# Patient Record
Sex: Female | Born: 1995 | Hispanic: Yes | Marital: Single | Smoking: Never smoker
Health system: Southern US, Community
[De-identification: ages and names within clinical notes are randomized; demographics above are authoritative.]

## PROBLEM LIST (undated history)

## (undated) DIAGNOSIS — S42309A Unspecified fracture of shaft of humerus, unspecified arm, initial encounter for closed fracture: Secondary | ICD-10-CM

## (undated) HISTORY — DX: Unspecified fracture of shaft of humerus, unspecified arm, initial encounter for closed fracture: S42.309A

---
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
## 2016-07-02 ENCOUNTER — Ambulatory Visit: Payer: Self-pay

## 2021-07-03 ENCOUNTER — Emergency Department (HOSPITAL_COMMUNITY): Payer: No Typology Code available for payment source

## 2021-07-03 ENCOUNTER — Inpatient Hospital Stay (HOSPITAL_COMMUNITY)
Admission: EM | Admit: 2021-07-03 | Discharge: 2021-07-04 | DRG: 535 | Disposition: A | Discharge status: Other Acute Inpatient Hospital | Payer: No Typology Code available for payment source | Attending: Orthopedic Surgery | Admitting: Orthopedic Surgery

## 2021-07-03 ENCOUNTER — Other Ambulatory Visit: Payer: Self-pay

## 2021-07-03 ENCOUNTER — Encounter (HOSPITAL_COMMUNITY): Payer: Self-pay | Admitting: Emergency Medicine

## 2021-07-03 DIAGNOSIS — S32401A Unspecified fracture of right acetabulum, initial encounter for closed fracture: Secondary | ICD-10-CM | POA: Diagnosis not present

## 2021-07-03 DIAGNOSIS — S73004A Unspecified dislocation of right hip, initial encounter: Secondary | ICD-10-CM | POA: Diagnosis not present

## 2021-07-03 DIAGNOSIS — Z09 Encounter for follow-up examination after completed treatment for conditions other than malignant neoplasm: Secondary | ICD-10-CM

## 2021-07-03 DIAGNOSIS — U071 COVID-19: Secondary | ICD-10-CM | POA: Diagnosis present

## 2021-07-03 DIAGNOSIS — Z560 Unemployment, unspecified: Secondary | ICD-10-CM

## 2021-07-03 DIAGNOSIS — S72001A Fracture of unspecified part of neck of right femur, initial encounter for closed fracture: Secondary | ICD-10-CM | POA: Diagnosis present

## 2021-07-03 DIAGNOSIS — Y92410 Unspecified street and highway as the place of occurrence of the external cause: Secondary | ICD-10-CM

## 2021-07-03 LAB — I-STAT BETA HCG BLOOD, ED (MC, WL, AP ONLY): I-stat hCG, quantitative: <5 m[IU]/mL (ref <5)

## 2021-07-03 MED ORDER — LACTATED RINGERS IV BOLUS
1000.0000 mL | Freq: Once | INTRAVENOUS | Status: AC
  Administered 2021-07-04: 02:00:00 1000 mL via INTRAVENOUS

## 2021-07-03 MED ORDER — OXYCODONE-ACETAMINOPHEN 5-325 MG PO TABS
1.0000 | ORAL_TABLET | Freq: Once | ORAL | Status: AC
  Administered 2021-07-03: 1 via ORAL
  Filled 2021-07-03: qty 1

## 2021-07-03 MED ORDER — METHOCARBAMOL 500 MG PO TABS
500.0000 mg | ORAL_TABLET | Freq: Once | ORAL | Status: AC
  Administered 2021-07-03: 500 mg via ORAL
  Filled 2021-07-03: qty 1

## 2021-07-03 MED ORDER — IBUPROFEN 200 MG PO TABS
600.0000 mg | ORAL_TABLET | Freq: Once | ORAL | Status: AC
  Administered 2021-07-03: 600 mg via ORAL
  Filled 2021-07-03: qty 3
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
## 2021-07-03 NOTE — ED Notes (Signed)
IV attempted x2

## 2021-07-04 ENCOUNTER — Encounter (HOSPITAL_COMMUNITY)
Admission: EM | Disposition: A | Discharge status: Other Acute Inpatient Hospital | Payer: Self-pay | Source: Home / Self Care | Attending: Orthopedic Surgery

## 2021-07-04 ENCOUNTER — Encounter (HOSPITAL_COMMUNITY)
Disposition: A | Discharge status: Home / Self Care | Payer: Self-pay | Source: Other Acute Inpatient Hospital | Attending: Student

## 2021-07-04 ENCOUNTER — Inpatient Hospital Stay (HOSPITAL_COMMUNITY): Payer: No Typology Code available for payment source | Admitting: Certified Registered"

## 2021-07-04 ENCOUNTER — Encounter (HOSPITAL_COMMUNITY): Payer: Self-pay | Admitting: Anesthesiology

## 2021-07-04 ENCOUNTER — Inpatient Hospital Stay (HOSPITAL_COMMUNITY): Payer: No Typology Code available for payment source

## 2021-07-04 ENCOUNTER — Encounter (HOSPITAL_COMMUNITY): Payer: Self-pay | Admitting: Orthopedic Surgery

## 2021-07-04 ENCOUNTER — Inpatient Hospital Stay (HOSPITAL_COMMUNITY)
Admit: 2021-07-04 | Discharge: 2021-07-05 | Disposition: A | Discharge status: Home / Self Care | Payer: No Typology Code available for payment source | Source: Other Acute Inpatient Hospital | Attending: Student | Admitting: Student

## 2021-07-04 DIAGNOSIS — Z560 Unemployment, unspecified: Secondary | ICD-10-CM | POA: Diagnosis not present

## 2021-07-04 DIAGNOSIS — U071 COVID-19: Secondary | ICD-10-CM | POA: Diagnosis present

## 2021-07-04 DIAGNOSIS — S73004A Unspecified dislocation of right hip, initial encounter: Secondary | ICD-10-CM | POA: Diagnosis present

## 2021-07-04 DIAGNOSIS — S32421A Displaced fracture of posterior wall of right acetabulum, initial encounter for closed fracture: Secondary | ICD-10-CM | POA: Diagnosis present

## 2021-07-04 DIAGNOSIS — S72001A Fracture of unspecified part of neck of right femur, initial encounter for closed fracture: Secondary | ICD-10-CM | POA: Diagnosis present

## 2021-07-04 DIAGNOSIS — S32401A Unspecified fracture of right acetabulum, initial encounter for closed fracture: Secondary | ICD-10-CM | POA: Diagnosis present

## 2021-07-04 DIAGNOSIS — Z419 Encounter for procedure for purposes other than remedying health state, unspecified: Secondary | ICD-10-CM

## 2021-07-04 DIAGNOSIS — Y92410 Unspecified street and highway as the place of occurrence of the external cause: Secondary | ICD-10-CM | POA: Diagnosis not present

## 2021-07-04 HISTORY — PX: ORIF ACETABULAR FRACTURE: SHX5029

## 2021-07-04 LAB — CBC WITH DIFFERENTIAL/PLATELET
Abs Immature Granulocytes: 0.02 10*3/uL (ref 0.00–0.07)
Basophils Absolute: 0 10*3/uL (ref 0.0–0.1)
Basophils Relative: 0 %
Eosinophils Absolute: 0 10*3/uL (ref 0.0–0.5)
Eosinophils Relative: 0 %
HCT: 35.9 % — ABNORMAL LOW (ref 36.0–46.0)
Hemoglobin: 11.6 g/dL — ABNORMAL LOW (ref 12.0–15.0)
Immature Granulocytes: 0 %
Lymphocytes Relative: 12 %
Lymphs Abs: 1.1 10*3/uL (ref 0.7–4.0)
MCH: 26.1 pg (ref 26.0–34.0)
MCHC: 32.3 g/dL (ref 30.0–36.0)
MCV: 80.7 fL (ref 80.0–100.0)
Monocytes Absolute: 0.7 10*3/uL (ref 0.1–1.0)
Monocytes Relative: 7 %
Neutro Abs: 7.3 10*3/uL (ref 1.7–7.7)
Neutrophils Relative %: 81 %
Platelets: 301 10*3/uL (ref 150–400)
RBC: 4.45 MIL/uL (ref 3.87–5.11)
RDW: 13.1 % (ref 11.5–15.5)
WBC: 9.1 10*3/uL (ref 4.0–10.5)
nRBC: 0 % (ref 0.0–0.2)

## 2021-07-04 LAB — BASIC METABOLIC PANEL
Anion gap: 7 (ref 5–15)
BUN: 9 mg/dL (ref 6–20)
CO2: 22 mmol/L (ref 22–32)
Calcium: 8.2 mg/dL — ABNORMAL LOW (ref 8.9–10.3)
Chloride: 107 mmol/L (ref 98–111)
Creatinine, Ser: 0.6 mg/dL (ref 0.44–1.00)
GFR, Estimated: >60 mL/min (ref >60)
Glucose, Bld: 147 mg/dL — ABNORMAL HIGH (ref 70–99)
Potassium: 3.8 mmol/L (ref 3.5–5.1)
Sodium: 136 mmol/L (ref 135–145)

## 2021-07-04 LAB — CBC
HCT: 34.3 % — ABNORMAL LOW (ref 36.0–46.0)
Hemoglobin: 11 g/dL — ABNORMAL LOW (ref 12.0–15.0)
MCH: 25.9 pg — ABNORMAL LOW (ref 26.0–34.0)
MCHC: 32.1 g/dL (ref 30.0–36.0)
MCV: 80.9 fL (ref 80.0–100.0)
Platelets: 282 10*3/uL (ref 150–400)
RBC: 4.24 MIL/uL (ref 3.87–5.11)
RDW: 13.2 % (ref 11.5–15.5)
WBC: 7.8 10*3/uL (ref 4.0–10.5)
nRBC: 0 % (ref 0.0–0.2)

## 2021-07-04 LAB — HIV ANTIBODY (ROUTINE TESTING W REFLEX): HIV Screen 4th Generation wRfx: NONREACTIVE

## 2021-07-04 LAB — CREATININE, SERUM
Creatinine, Ser: 0.6 mg/dL (ref 0.44–1.00)
GFR, Estimated: >60 mL/min (ref >60)

## 2021-07-04 LAB — VITAMIN D 25 HYDROXY (VIT D DEFICIENCY, FRACTURES): Vit D, 25-Hydroxy: 11.75 ng/mL — ABNORMAL LOW (ref 30–100)

## 2021-07-04 LAB — RESP PANEL BY RT-PCR (FLU A&B, COVID) ARPGX2
Influenza A by PCR: NEGATIVE
Influenza B by PCR: NEGATIVE
SARS Coronavirus 2 by RT PCR: POSITIVE — AB

## 2021-07-04 LAB — GLUCOSE, CAPILLARY: Glucose-Capillary: 160 mg/dL — ABNORMAL HIGH (ref 70–99)

## 2021-07-04 SURGERY — OPEN REDUCTION INTERNAL FIXATION ACETABULUM POSTERIOR LATERAL
Anesthesia: General | Laterality: Right

## 2021-07-04 SURGERY — OPEN REDUCTION INTERNAL FIXATION (ORIF) ACETABULAR FRACTURE
Anesthesia: General | Site: Shoulder | Laterality: Right

## 2021-07-04 MED ORDER — PROPOFOL 10 MG/ML IV BOLUS
1.0000 mg/kg | Freq: Once | INTRAVENOUS | Status: AC
  Administered 2021-07-04: 01:00:00 85.7 mg via INTRAVENOUS
  Filled 2021-07-04: qty 20

## 2021-07-04 MED ORDER — HYDROMORPHONE HCL 1 MG/ML IJ SOLN: 0.5000 mg | INTRAMUSCULAR | Status: DC | PRN

## 2021-07-04 MED ORDER — METHOCARBAMOL 500 MG PO TABS: 500.0000 mg | ORAL_TABLET | Freq: Four times a day (QID) | ORAL | Status: DC | PRN

## 2021-07-04 MED ORDER — LACTATED RINGERS IV SOLN: INTRAVENOUS | Status: DC | PRN

## 2021-07-04 MED ORDER — DOCUSATE SODIUM 100 MG PO CAPS
100.0000 mg | ORAL_CAPSULE | Freq: Two times a day (BID) | ORAL | Status: DC
  Administered 2021-07-04 – 2021-07-05 (×2): 100 mg via ORAL
  Filled 2021-07-04 (×2): qty 1

## 2021-07-04 MED ORDER — CELECOXIB 200 MG PO CAPS
200.0000 mg | ORAL_CAPSULE | Freq: Two times a day (BID) | ORAL | Status: DC
  Administered 2021-07-04 – 2021-07-05 (×2): 200 mg via ORAL
  Filled 2021-07-04 (×3): qty 1

## 2021-07-04 MED ORDER — OXYCODONE HCL 5 MG/5ML PO SOLN: 5.0000 mg | Freq: Once | ORAL | Status: DC | PRN

## 2021-07-04 MED ORDER — PROMETHAZINE HCL 25 MG/ML IJ SOLN: 6.2500 mg | INTRAMUSCULAR | Status: DC | PRN

## 2021-07-04 MED ORDER — LIP MEDEX EX OINT
TOPICAL_OINTMENT | CUTANEOUS | Status: AC
  Administered 2021-07-04: 1
  Filled 2021-07-04: qty 7

## 2021-07-04 MED ORDER — ACETAMINOPHEN 500 MG PO TABS
500.0000 mg | ORAL_TABLET | Freq: Four times a day (QID) | ORAL | Status: DC
  Administered 2021-07-04 (×2): 500 mg via ORAL
  Filled 2021-07-04 (×2): qty 1

## 2021-07-04 MED ORDER — LIDOCAINE HCL (CARDIAC) PF 100 MG/5ML IV SOSY
PREFILLED_SYRINGE | INTRAVENOUS | Status: DC | PRN
  Administered 2021-07-04: 80 mg via INTRATRACHEAL

## 2021-07-04 MED ORDER — CHLORHEXIDINE GLUCONATE 0.12 % MT SOLN
OROMUCOSAL | Status: AC
  Administered 2021-07-04: 15:00:00 15 mL
  Filled 2021-07-04: qty 15

## 2021-07-04 MED ORDER — KETOROLAC TROMETHAMINE 30 MG/ML IJ SOLN
INTRAMUSCULAR | Status: AC
  Filled 2021-07-04: qty 1

## 2021-07-04 MED ORDER — ONDANSETRON HCL 4 MG/2ML IJ SOLN
4.0000 mg | Freq: Four times a day (QID) | INTRAMUSCULAR | Status: DC | PRN
  Administered 2021-07-05 (×2): 4 mg via INTRAVENOUS
  Filled 2021-07-04 (×2): qty 2

## 2021-07-04 MED ORDER — AMISULPRIDE (ANTIEMETIC) 5 MG/2ML IV SOLN: 10.0000 mg | Freq: Once | INTRAVENOUS | Status: DC | PRN

## 2021-07-04 MED ORDER — ONDANSETRON HCL 4 MG PO TABS: 4.0000 mg | ORAL_TABLET | Freq: Four times a day (QID) | ORAL | Status: DC | PRN

## 2021-07-04 MED ORDER — OXYCODONE HCL 5 MG PO TABS
5.0000 mg | ORAL_TABLET | ORAL | Status: DC | PRN
  Administered 2021-07-04 – 2021-07-05 (×2): 5 mg via ORAL
  Filled 2021-07-04 (×2): qty 1

## 2021-07-04 MED ORDER — HYDROMORPHONE HCL 1 MG/ML IJ SOLN
INTRAMUSCULAR | Status: AC
  Filled 2021-07-04: qty 1

## 2021-07-04 MED ORDER — PROPOFOL 10 MG/ML IV BOLUS
INTRAVENOUS | Status: AC | PRN
  Administered 2021-07-04: 25 mg via INTRAVENOUS

## 2021-07-04 MED ORDER — ENOXAPARIN SODIUM 40 MG/0.4ML IJ SOSY
40.0000 mg | PREFILLED_SYRINGE | INTRAMUSCULAR | Status: DC
  Administered 2021-07-05: 40 mg via SUBCUTANEOUS
  Filled 2021-07-04: qty 0.4

## 2021-07-04 MED ORDER — METOCLOPRAMIDE HCL 5 MG PO TABS
5.0000 mg | ORAL_TABLET | Freq: Three times a day (TID) | ORAL | Status: DC | PRN
  Administered 2021-07-05: 5 mg via ORAL
  Filled 2021-07-04: qty 1

## 2021-07-04 MED ORDER — ONDANSETRON HCL 4 MG/2ML IJ SOLN: 4.0000 mg | Freq: Four times a day (QID) | INTRAMUSCULAR | Status: DC | PRN

## 2021-07-04 MED ORDER — ACETAMINOPHEN 500 MG PO TABS
1000.0000 mg | ORAL_TABLET | Freq: Three times a day (TID) | ORAL | Status: DC
  Administered 2021-07-04 – 2021-07-05 (×4): 1000 mg via ORAL
  Filled 2021-07-04 (×4): qty 2

## 2021-07-04 MED ORDER — ENOXAPARIN SODIUM 40 MG/0.4ML IJ SOSY
40.0000 mg | PREFILLED_SYRINGE | INTRAMUSCULAR | Status: DC
  Filled 2021-07-04: qty 0.4

## 2021-07-04 MED ORDER — METHOCARBAMOL 1000 MG/10ML IJ SOLN
500.0000 mg | Freq: Four times a day (QID) | INTRAVENOUS | Status: DC | PRN
  Filled 2021-07-04: qty 5

## 2021-07-04 MED ORDER — MIDAZOLAM HCL 2 MG/2ML IJ SOLN
INTRAMUSCULAR | Status: DC | PRN
  Administered 2021-07-04: 2 mg via INTRAVENOUS

## 2021-07-04 MED ORDER — HYDROMORPHONE HCL 1 MG/ML IJ SOLN: 0.2500 mg | INTRAMUSCULAR | Status: DC | PRN

## 2021-07-04 MED ORDER — AMISULPRIDE (ANTIEMETIC) 5 MG/2ML IV SOLN
INTRAVENOUS | Status: AC
  Filled 2021-07-04: qty 4

## 2021-07-04 MED ORDER — HYDROCODONE-ACETAMINOPHEN 7.5-325 MG PO TABS: 1.0000 | ORAL_TABLET | ORAL | Status: DC | PRN

## 2021-07-04 MED ORDER — SODIUM CHLORIDE 0.9 % IV SOLN: INTRAVENOUS | Status: DC

## 2021-07-04 MED ORDER — HYDROCODONE-ACETAMINOPHEN 5-325 MG PO TABS
1.0000 | ORAL_TABLET | ORAL | Status: DC | PRN
  Administered 2021-07-04: 12:00:00 1 via ORAL
  Filled 2021-07-04: qty 1

## 2021-07-04 MED ORDER — MORPHINE SULFATE (PF) 2 MG/ML IV SOLN
0.5000 mg | INTRAVENOUS | Status: DC | PRN
  Administered 2021-07-04: 02:00:00 1 mg via INTRAVENOUS
  Filled 2021-07-04: qty 1

## 2021-07-04 MED ORDER — OXYCODONE HCL 5 MG PO TABS: 5.0000 mg | ORAL_TABLET | Freq: Once | ORAL | Status: DC | PRN

## 2021-07-04 MED ORDER — LIP MEDEX EX OINT
TOPICAL_OINTMENT | Freq: Once | CUTANEOUS | Status: AC
  Administered 2021-07-04: 1 via TOPICAL

## 2021-07-04 MED ORDER — MEPERIDINE HCL 25 MG/ML IJ SOLN: 6.2500 mg | INTRAMUSCULAR | Status: DC | PRN

## 2021-07-04 MED ORDER — PROPOFOL 10 MG/ML IV BOLUS
INTRAVENOUS | Status: DC | PRN
  Administered 2021-07-04: 50 mg via INTRAVENOUS
  Administered 2021-07-04: 150 mg via INTRAVENOUS

## 2021-07-04 MED ORDER — METOCLOPRAMIDE HCL 5 MG/ML IJ SOLN: 5.0000 mg | Freq: Three times a day (TID) | INTRAMUSCULAR | Status: DC | PRN

## 2021-07-04 MED ORDER — ACETAMINOPHEN 325 MG PO TABS: 325.0000 mg | ORAL_TABLET | Freq: Four times a day (QID) | ORAL | Status: DC | PRN

## 2021-07-04 MED ORDER — LIP MEDEX EX OINT
TOPICAL_OINTMENT | CUTANEOUS | Status: AC
  Filled 2021-07-04: qty 7

## 2021-07-04 MED ORDER — KETAMINE HCL 50 MG/5ML IJ SOSY
1.0000 mg/kg | PREFILLED_SYRINGE | Freq: Once | INTRAMUSCULAR | Status: AC
  Administered 2021-07-04: 01:00:00 50 mg via INTRAVENOUS
  Filled 2021-07-04: qty 10

## 2021-07-04 MED ORDER — POLYETHYLENE GLYCOL 3350 17 G PO PACK: 17.0000 g | PACK | Freq: Every day | ORAL | Status: DC | PRN

## 2021-07-04 MED ORDER — CEFAZOLIN SODIUM-DEXTROSE 2-4 GM/100ML-% IV SOLN
INTRAVENOUS | Status: AC
  Filled 2021-07-04: qty 100

## 2021-07-04 MED ORDER — KETOROLAC TROMETHAMINE 30 MG/ML IJ SOLN: 30.0000 mg | Freq: Once | INTRAMUSCULAR | Status: DC | PRN

## 2021-07-04 SURGICAL SUPPLY — 1 items: PAD ARMBOARD 7.5X6 YLW CONV (MISCELLANEOUS) ×3 IMPLANT

## 2021-07-04 NOTE — ED Notes (Signed)
[facility] here to take pt.  

## 2021-07-04 NOTE — Anesthesia Preprocedure Evaluation (Deleted)
Anesthesia Evaluation    Reviewed: Allergy & Precautions, Patient's Chart, lab work & pertinent test results  Airway        Dental   Pulmonary neg pulmonary ROS,           Cardiovascular negative cardio ROS       Neuro/Psych negative neurological ROS  negative psych ROS   GI/Hepatic negative GI ROS, Neg liver ROS,   Endo/Other  Obesity BMI 37  Renal/GU negative Renal ROS  negative genitourinary   Musculoskeletal R acetabular fx    Abdominal   Peds  Hematology negative hematology ROS (+) hct 35.9   Anesthesia Other Findings   Reproductive/Obstetrics                             Anesthesia Physical Anesthesia Plan Anesthesia Quick Evaluation

## 2021-07-04 NOTE — ED Notes (Signed)
Pt c/o right foot tingling. Ortho tech, Wille Glaser, paged to evaluate.

## 2021-07-04 NOTE — ED Notes (Signed)
Verified with Silvestre Gunner, PA-C with Ortho, pt likely not to go to OR today. Verified pt to receive 10:00am dose of Lovenox this morning.

## 2021-07-04 NOTE — Interval H&P Note (Signed)
History and Physical Interval Note:  07/04/2021 2:49 PM  Tina Abbott  has presented today for surgery, with the diagnosis of Right acetabular fracture dislocation.  The various methods of treatment have been discussed with the patient and family. After consideration of risks, benefits and other options for treatment, the patient has consented to  Procedure(s): STRESS EXAM OF HIP WITH POSSIBLE OPEN REDUCTION INTERNAL FIXATION ACETABULUM POSTERIOR LATERAL (Right) as a surgical intervention.  The patient's history has been reviewed, patient examined, no change in status, stable for surgery.  I have reviewed the patient's chart and labs.  Questions were answered to the patient's satisfaction.     Lennette Bihari P Aneisha Skyles
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
## 2021-07-04 NOTE — ED Notes (Signed)
Dillard Cannon, Ortho PA-C at the bedside to evaluate.
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
## 2021-07-04 NOTE — ED Notes (Addendum)
Ortho tech evaluated pt. Per Wille Glaser with Ortho, pt to wiggle toes intermittently to prevent tingling. Per Wille Glaser, otherwise, nothing concerning noted. Pt remains in Buck's traction.

## 2021-07-04 NOTE — Progress Notes (Signed)
RT present for duration of sedation procedure.  Pt remained stable and comfortable throughout procedure on EtCO2 monitoring.

## 2021-07-04 NOTE — ED Notes (Signed)
Per Katha Hamming, MD with Ortho, pt to now likely go to [facility] OR around 14:30 - 15:00 today. Ortho to consult pt at [facility].

## 2021-07-04 NOTE — Plan of Care (Signed)
°  Problem: Education: Goal: Knowledge of General Education information will improve Description: Including pain rating scale, medication(s)/side effects and non-pharmacologic comfort measures Outcome: Progressing   Problem: Health Behavior/Discharge Planning: Goal: Ability to manage health-related needs will improve Outcome: Progressing   Problem: Clinical Measurements: Goal: Ability to maintain clinical measurements within normal limits will improve Outcome: Progressing   Problem: Activity: Goal: Risk for activity intolerance will decrease Outcome: Progressing   Problem: Pain Managment: Goal: General experience of comfort will improve Outcome: Progressing   Problem: Safety: Goal: Ability to remain free from injury will improve Outcome: Progressing   

## 2021-07-04 NOTE — Progress Notes (Signed)
Orthopedic Tech Progress Note Patient Details:  Jersee Winiarski Sep 25, 1995 [id number]  Musculoskeletal Traction Type of Traction: Bucks Skin Traction Traction Location: RLE Traction Weight: 15 lbs   Post Interventions Patient Tolerated: Well Traction applied per request of MD; abd pillow no longer needed. Vernona Rieger 07/04/2021, 2:31 AM
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
## 2021-07-04 NOTE — Interval H&P Note (Signed)
History and Physical Interval Note:  07/04/2021 2:51 PM  Tina Abbott  has presented today for surgery, with the diagnosis of Right acetabular fracture dislocation.  The various methods of treatment have been discussed with the patient and family. After consideration of risks, benefits and other options for treatment, the patient has consented to  Procedure(s): STRESS EXAM OF HIP WITH POSSIBLE OPEN REDUCTION INTERNAL FIXATION ACETABULUM POSTERIOR LATERAL (Right) as a surgical intervention.  The patient's history has been reviewed, patient examined, no change in status, stable for surgery.  I have reviewed the patient's chart and labs.  Questions were answered to the patient's satisfaction.     Lennette Bihari P Anaijah Augsburger

## 2021-07-04 NOTE — ED Notes (Signed)
Ortho at the bedside.

## 2021-07-04 NOTE — Transfer of Care (Signed)
Immediate Anesthesia Transfer of Care Note  Patient: Tina Abbott  Procedure(s) Performed: STRESS EXAM OF HIP WITH POSSIBLE OPEN REDUCTION INTERNAL FIXATION ACETABULUM POSTERIOR LATERAL (Right: Shoulder)  Patient Location: PACU  Anesthesia Type:General  Level of Consciousness: awake, alert , oriented and patient cooperative  Airway & Oxygen Therapy: Patient Spontanous Breathing  Post-op Assessment: Report given to RN, Post -op Vital signs reviewed and stable and Patient moving all extremities X 4  Post vital signs: Reviewed and stable  Last Vitals:  Vitals Value Taken Time  BP 117/88 07/04/21 1559  Temp    Pulse 104 07/04/21 1605  Resp 25 07/04/21 1606  SpO2 95 % 07/04/21 1605  Vitals shown include unvalidated device data.  Last Pain: There were no vitals filed for this visit.       Complications: No notable events documented.

## 2021-07-04 NOTE — Anesthesia Postprocedure Evaluation (Signed)
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  Procedure(s) Performed: STRESS EXAM OF HIP (Right: Shoulder)     Patient location during evaluation: PACU Anesthesia Type: General Level of consciousness: awake and alert Pain management: pain level controlled Vital Signs Assessment: post-procedure vital signs reviewed and stable Respiratory status: spontaneous breathing, nonlabored ventilation, respiratory function stable and patient connected to nasal cannula oxygen Cardiovascular status: blood pressure returned to baseline and stable Postop Assessment: no apparent nausea or vomiting Anesthetic complications: no   No notable events documented.  Last Vitals:  Vitals:   07/04/21 1630 07/04/21 1659  BP: 114/69 (!) 110/57  Pulse: 93 91  Resp: 16 16  Temp: 37.1 C 36.8 C  SpO2: 96% 100%    Last Pain:  Vitals:   07/04/21 1659  TempSrc: Oral  PainSc:                  March Rummage Rya Rausch

## 2021-07-04 NOTE — ED Notes (Signed)
Date and time results received: 07/04/21 0410 (use smartphrase ".now" to insert current time)  Test: covid Critical Value: positive  Name of Provider Notified: A. Mable Fill, MD   Orders Received? Or Actions Taken?:  n/a

## 2021-07-04 NOTE — ED Notes (Signed)
Ortho Tech paged to release pt from Buck's traction

## 2021-07-04 NOTE — ED Notes (Signed)
Per Unit Secretary, no scheduled OR time for pt at [facility] at this time. [facility] to call with OR time, when available. This nurse relayed this information to the pt and her Mother. Pt and Mother express understanding.

## 2021-07-04 NOTE — ED Notes (Signed)
Pt attached to cardiac monitor x3. VSS. NAD. Pt A&O x4. Pt rates pain 3/10 to the right hip. Tylenol administered at this time. Buck's traction remains in place to the right leg.
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
## 2021-07-04 NOTE — ED Notes (Signed)
This nurse spoke with Colletta Maryland at [facility]. [facility] to transport pt from this ED to [facility] for OR. [facility] scheduled to transport pt at 1400 today.

## 2021-07-04 NOTE — ED Notes (Signed)
Transportation called and delivering a hospital bed

## 2021-07-05 ENCOUNTER — Encounter (HOSPITAL_COMMUNITY): Payer: Self-pay | Admitting: Student

## 2021-07-05 ENCOUNTER — Other Ambulatory Visit (HOSPITAL_COMMUNITY): Payer: Self-pay

## 2021-07-05 LAB — BASIC METABOLIC PANEL
Anion gap: 9 (ref 5–15)
BUN: 7 mg/dL (ref 6–20)
CO2: 23 mmol/L (ref 22–32)
Calcium: 8.4 mg/dL — ABNORMAL LOW (ref 8.9–10.3)
Chloride: 105 mmol/L (ref 98–111)
Creatinine, Ser: 0.58 mg/dL (ref 0.44–1.00)
GFR, Estimated: >60 mL/min (ref >60)
Glucose, Bld: 143 mg/dL — ABNORMAL HIGH (ref 70–99)
Potassium: 3.7 mmol/L (ref 3.5–5.1)
Sodium: 137 mmol/L (ref 135–145)

## 2021-07-05 LAB — CBC
HCT: 32.9 % — ABNORMAL LOW (ref 36.0–46.0)
Hemoglobin: 10.6 g/dL — ABNORMAL LOW (ref 12.0–15.0)
MCH: 26.4 pg (ref 26.0–34.0)
MCHC: 32.2 g/dL (ref 30.0–36.0)
MCV: 81.8 fL (ref 80.0–100.0)
Platelets: 277 10*3/uL (ref 150–400)
RBC: 4.02 MIL/uL (ref 3.87–5.11)
RDW: 13.1 % (ref 11.5–15.5)
WBC: 7.8 10*3/uL (ref 4.0–10.5)
nRBC: 0 % (ref 0.0–0.2)

## 2021-07-05 LAB — GLUCOSE, CAPILLARY: Glucose-Capillary: 103 mg/dL — ABNORMAL HIGH (ref 70–99)

## 2021-07-05 MED ORDER — APIXABAN 2.5 MG PO TABS
2.5000 mg | ORAL_TABLET | Freq: Two times a day (BID) | ORAL | 0 refills | Status: AC
  Filled 2021-07-05: qty 60, 30d supply, fill #0

## 2021-07-05 MED ORDER — METHOCARBAMOL 500 MG PO TABS
500.0000 mg | ORAL_TABLET | Freq: Four times a day (QID) | ORAL | 0 refills | Status: AC | PRN
  Filled 2021-07-05: qty 15, 4d supply, fill #0

## 2021-07-05 MED ORDER — OXYCODONE HCL 5 MG PO TABS
5.0000 mg | ORAL_TABLET | ORAL | 0 refills | Status: AC | PRN
  Filled 2021-07-05: qty 30, 5d supply, fill #0

## 2021-07-05 NOTE — Discharge Instructions (Addendum)
Orthopaedic Trauma Service Discharge Instructions   General Discharge Instructions  WEIGHT BEARING STATUS:Touchdown weightbearing right leg  RANGE OF MOTION/ACTIVITY: Posterior hip precautions   DVT/PE prophylaxis:  Eliquis twice daily x 30 days  Diet: as you were eating previously.  Can use over the counter stool softeners and bowel preparations, such as Miralax, to help with bowel movements.  Narcotics can be constipating.  Be sure to drink plenty of fluids  PAIN MEDICATION USE AND EXPECTATIONS  You have likely been given narcotic medications to help control your pain.  After a traumatic event that results in an fracture (broken bone) with or without surgery, it is ok to use narcotic pain medications to help control one's pain.  We understand that everyone responds to pain differently and each individual patient will be evaluated on a regular basis for the continued need for narcotic medications. Ideally, narcotic medication use should last no more than 6-8 weeks (coinciding with fracture healing).   As a patient it is your responsibility as well to monitor narcotic medication use and report the amount and frequency you use these medications when you come to your office visit.   We would also advise that if you are using narcotic medications, you should take a dose prior to therapy to maximize you participation.  IF YOU ARE ON NARCOTIC MEDICATIONS IT IS NOT PERMISSIBLE TO OPERATE A MOTOR VEHICLE (MOTORCYCLE/CAR/TRUCK/MOPED) OR HEAVY MACHINERY DO NOT MIX NARCOTICS WITH OTHER CNS (CENTRAL NERVOUS SYSTEM) DEPRESSANTS SUCH AS ALCOHOL   STOP SMOKING OR USING NICOTINE PRODUCTS!!!!  As discussed nicotine severely impairs your body's ability to heal surgical and traumatic wounds but also impairs bone healing.  Wounds and bone heal by forming microscopic blood vessels (angiogenesis) and nicotine is a vasoconstrictor (essentially, shrinks blood vessels).  Therefore, if vasoconstriction occurs to  these microscopic blood vessels they essentially disappear and are unable to deliver necessary nutrients to the healing tissue.  This is one modifiable factor that you can do to dramatically increase your chances of healing your injury.    (This means no smoking, no nicotine gum, patches, etc)  DO NOT USE NONSTEROIDAL ANTI-INFLAMMATORY DRUGS (NSAID'S)  Using products such as Advil (ibuprofen), Aleve (naproxen), Motrin (ibuprofen) for additional pain control during fracture healing can delay and/or prevent the healing response.  If you would like to take over the counter (OTC) medication, Tylenol (acetaminophen) is ok.  However, some narcotic medications that are given for pain control contain acetaminophen as well. Therefore, you should not exceed more than 4000 mg of tylenol in a day if you do not have liver disease.  Also note that there are may OTC medicines, such as cold medicines and allergy medicines that my contain tylenol as well.  If you have any questions about medications and/or interactions please ask your doctor/PA or your pharmacist.      ICE AND ELEVATE INJURED/OPERATIVE EXTREMITY  Using ice and elevating the injured extremity above your heart can help with swelling and pain control.  Icing in a pulsatile fashion, such as 20 minutes on and 20 minutes off, can be followed.    Do not place ice directly on skin. Make sure there is a barrier between to skin and the ice pack.    Using frozen items such as frozen peas works well as the conform nicely to the are that needs to be iced.  USE AN ACE WRAP OR TED HOSE FOR SWELLING CONTROL  In addition to icing and elevation, Ace wraps or TED  hose are used to help limit and resolve swelling.  It is recommended to use Ace wraps or TED hose until you are informed to stop.    When using Ace Wraps start the wrapping distally (farthest away from the body) and wrap proximally (closer to the body)   Example: If you had surgery on your leg or thing and you  do not have a splint on, start the ace wrap at the toes and work your way up to the thigh        If you had surgery on your upper extremity and do not have a splint on, start the ace wrap at your fingers and work your way up to the upper arm   [place]: [phone]   VISIT OUR WEBSITE FOR ADDITIONAL INFORMATION: [email or web address]    Information on my medicine - ELIQUIS (apixaban)  This medication education was reviewed with me or my healthcare representative as part of my discharge preparation.  The pharmacist that spoke with me during my hospital stay was:  Brain Hilts, [facility]  Why was Eliquis prescribed for you? Eliquis was prescribed for you to reduce the risk of blood clots forming after orthopedic surgery.    What do You need to know about Eliquis? Take your Eliquis TWICE DAILY - one tablet in the morning and one tablet in the evening with or without food.  It would be best to take the dose about the same time each day.  If you have difficulty swallowing the tablet whole please discuss with your pharmacist how to take the medication safely.  Take Eliquis exactly as prescribed by your doctor and DO NOT stop taking Eliquis without talking to the doctor who prescribed the medication.  Stopping without other medication to take the place of Eliquis may increase your risk of developing a clot.  After discharge, you should have regular check-up appointments with your healthcare provider that is prescribing your Eliquis.  What do you do if you miss a dose? If a dose of ELIQUIS is not taken at the scheduled time, take it as soon as possible on the same day and twice-daily administration should be resumed.  The dose should not be doubled to make up for a missed dose.  Do not take more than one tablet of ELIQUIS at the same time.  Important Safety Information A possible side effect of Eliquis is bleeding. You should call your healthcare  provider right away if you experience any of the following: Bleeding from an injury or your nose that does not stop. Unusual colored urine (red or dark brown) or unusual colored stools (red or black). Unusual bruising for unknown reasons. A serious fall or if you hit your head (even if there is no bleeding).  Some medicines may interact with Eliquis and might increase your risk of bleeding or clotting while on Eliquis. To help avoid this, consult your healthcare provider or pharmacist prior to using any new prescription or non-prescription medications, including herbals, vitamins, non-steroidal anti-inflammatory drugs (NSAIDs) and supplements.  This website has more information on Eliquis (apixaban): http://www.eliquis.com/eliquis/home
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
## 2021-07-05 NOTE — Progress Notes (Signed)
Patient discharged home via private vehicle. Discharge instructions reviewed. TOC medications and DME (3in1 and walker) dropped off at room.
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
## 2021-07-05 NOTE — Care Management (Signed)
Ordered youth walker and 3 in 1 from [place] with [facility]. To be delivered to room prior to discharge.

## 2021-07-19 ENCOUNTER — Other Ambulatory Visit (HOSPITAL_COMMUNITY): Payer: Self-pay

## 2021-07-23 ENCOUNTER — Telehealth (HOSPITAL_COMMUNITY): Payer: Self-pay | Admitting: Pharmacy Technician

## 2021-07-23 NOTE — Telephone Encounter (Signed)
Transitions of Care Pharmacy   Call attempted for a pharmacy transitions of care follow-up. HIPAA appropriate voicemail was left with call back information provided.   Call attempt #1. Will follow-up in 2-3 days.   Parthenia Ames, PharmD
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
## 2021-08-14 ENCOUNTER — Encounter: Payer: Self-pay | Admitting: Physical Therapy

## 2021-08-14 ENCOUNTER — Other Ambulatory Visit: Payer: Self-pay

## 2021-08-14 ENCOUNTER — Ambulatory Visit: Payer: Self-pay | Attending: Student | Admitting: Physical Therapy

## 2021-08-14 DIAGNOSIS — M25551 Pain in right hip: Secondary | ICD-10-CM | POA: Insufficient documentation

## 2021-08-14 DIAGNOSIS — R2681 Unsteadiness on feet: Secondary | ICD-10-CM | POA: Insufficient documentation

## 2021-08-14 DIAGNOSIS — R262 Difficulty in walking, not elsewhere classified: Secondary | ICD-10-CM | POA: Insufficient documentation

## 2021-08-14 DIAGNOSIS — M6281 Muscle weakness (generalized): Secondary | ICD-10-CM | POA: Insufficient documentation

## 2021-08-14 NOTE — Patient Instructions (Signed)
Access Code: [email or web address] URL: [email or web address] Date: 08/14/2021 Prepared by: Ethel Rana  Exercises Supine Bridge with Resistance Band - 1 x daily - 7 x weekly - 1 sets - 10 reps Hooklying Clamshell with Resistance - 1 x daily - 7 x weekly - 1 sets - 10 reps Standing Hip Abduction - 1 x daily - 7 x weekly - 1 sets - 10 reps
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
## 2021-08-22 ENCOUNTER — Encounter: Payer: Self-pay | Admitting: Physical Therapy

## 2021-08-22 ENCOUNTER — Other Ambulatory Visit: Payer: Self-pay

## 2021-08-22 ENCOUNTER — Ambulatory Visit: Payer: Self-pay | Attending: Student | Admitting: Physical Therapy

## 2021-08-22 DIAGNOSIS — M6281 Muscle weakness (generalized): Secondary | ICD-10-CM | POA: Insufficient documentation

## 2021-08-22 DIAGNOSIS — M25551 Pain in right hip: Secondary | ICD-10-CM | POA: Insufficient documentation

## 2021-08-22 DIAGNOSIS — R262 Difficulty in walking, not elsewhere classified: Secondary | ICD-10-CM | POA: Insufficient documentation

## 2021-08-22 DIAGNOSIS — R2681 Unsteadiness on feet: Secondary | ICD-10-CM | POA: Insufficient documentation

## 2021-08-22 NOTE — Patient Instructions (Signed)
Access Code: [email or web address] URL: [email or web address] Date: 08/22/2021 Prepared by: Ethel Rana  Exercises Supine Bridge with Resistance Band - 1 x daily - 7 x weekly - 2 sets - 10 reps Hooklying Clamshell with Resistance - 1 x daily - 7 x weekly - 2 sets - 10 reps Supine Straight Leg Raises - 1 x daily - 7 x weekly - 2 sets - 10 reps Sidelying Hip Abduction - 1 x daily - 7 x weekly - 5 sets - 10 reps Prone Hip Extension with Bent Knee - 1 x daily - 7 x weekly - 2 sets - 10 reps Prone Hip Extension - 1 x daily - 7 x weekly - 2 sets - 10 reps Standing Heel Raise - 1 x daily - 7 x weekly - 3 sets - 10 reps Standing 3-Way Leg Reach with Resistance at Ankles and Counter Support - 1 x daily - 7 x weekly - 2 sets - 10 reps
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
## 2021-08-29 ENCOUNTER — Other Ambulatory Visit: Payer: Self-pay

## 2021-08-29 ENCOUNTER — Ambulatory Visit: Payer: Self-pay | Admitting: Physical Therapy

## 2021-08-29 ENCOUNTER — Encounter: Payer: Self-pay | Admitting: Physical Therapy

## 2021-08-29 DIAGNOSIS — R2681 Unsteadiness on feet: Secondary | ICD-10-CM

## 2021-08-29 DIAGNOSIS — M25551 Pain in right hip: Secondary | ICD-10-CM

## 2021-08-29 DIAGNOSIS — M6281 Muscle weakness (generalized): Secondary | ICD-10-CM

## 2021-08-29 DIAGNOSIS — R262 Difficulty in walking, not elsewhere classified: Secondary | ICD-10-CM
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
## 2021-09-05 ENCOUNTER — Other Ambulatory Visit: Payer: Self-pay

## 2021-09-05 ENCOUNTER — Encounter: Payer: Self-pay | Admitting: Physical Therapy

## 2021-09-05 ENCOUNTER — Ambulatory Visit: Payer: Self-pay | Admitting: Physical Therapy

## 2021-09-05 DIAGNOSIS — R262 Difficulty in walking, not elsewhere classified: Secondary | ICD-10-CM

## 2021-09-05 DIAGNOSIS — R2681 Unsteadiness on feet: Secondary | ICD-10-CM

## 2021-09-05 DIAGNOSIS — M25551 Pain in right hip: Secondary | ICD-10-CM

## 2021-09-05 DIAGNOSIS — M6281 Muscle weakness (generalized): Secondary | ICD-10-CM

## 2021-09-05 NOTE — Therapy (Signed)
[place]. [place], [place], [address] Phone: [phone]   Fax:  [phone]  Physical Therapy Treatment  Patient Details  Name: Tina Abbott MRN: [id number] Date of Birth: [birth year] Referring Provider (PT): Mcclung   Encounter Date: 09/05/2021   PT End of Session - 09/05/21 1059     Visit Number 4    Number of Visits 16    Date for PT Re-Evaluation 10/23/21    PT Start Time 1017    PT Stop Time 1058    PT Time Calculation (min) 41 min    Activity Tolerance Patient tolerated treatment well    Behavior During Therapy [facility] for tasks assessed/performed             Past Medical History:  Diagnosis Date   Broken arm 07/22/2004   left forearm    Past Surgical History:  Procedure Laterality Date   ORIF ACETABULAR FRACTURE Right 07/04/2021   Procedure: STRESS EXAM OF HIP;  Surgeon: Shona Needles, MD;  Location: [place];  Service: Orthopedics;  Laterality: Right;    There were no vitals filed for this visit.   Subjective Assessment - 09/05/21 1020     Subjective Patient reports lateral R knee pain, which has been present since she started back to walking, but is worse recently.    Currently in Pain? Yes    Pain Score 7     Pain Location Knee    Pain Orientation Right;Lateral    Pain Descriptors / Indicators Sharp    Pain Type Acute pain    Pain Onset More than a month ago    Pain Frequency Intermittent                               [facility] Adult PT Treatment/Exercise - 09/05/21 0001       Knee/Hip Exercises: Stretches   Active Hamstring Stretch Right;3 reps;10 seconds    Passive Hamstring Stretch Right;3 reps;20 seconds    Passive Hamstring Stretch Limitations Using stap, also stretched adductors and ITB.    Other Knee/Hip Stretches Multiple ITB stretches, modified due to sever tightness of hip ER.      Knee/Hip Exercises: Standing   Other Standing Knee  Exercises R hip hikes on step 2 x 10      Manual Therapy   Manual Therapy Soft tissue mobilization;Passive ROM    Soft tissue mobilization ITB-R    Passive ROM R hip ROM                     PT Education - 09/05/21 1059     Education Details Updated HEP, emphasizing R hip streteches.    Person(s) Educated Patient    Methods Explanation;Demonstration;Handout    Comprehension Returned demonstration;Verbalized understanding              PT Short Term Goals - 08/14/21 1141       PT SHORT TERM GOAL #1   Title I with initial HEP    Baseline Initiated.    Time 4    Period Weeks    Status New    Target Date 09/11/21               PT Long Term Goals - 08/22/21 1430       PT LONG TERM GOAL #2   Baseline 10.29    Time 9    Period Weeks  Status On-going    Target Date 10/23/21                   Plan - 09/05/21 1033     Clinical Impression Statement Patient reported R lateral knee pain. Her ITB is very tight. Therapsit provided STM and then updated HEP with multpile exercises to faciltiate R hip ROm. she is very tight in ER. She participated well. HEP address hip stability, so added the stretches.    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     PT Home Exercise Plan [phone]             Patient will benefit from skilled therapeutic intervention in order to improve the following deficits and impairments:  Abnormal gait, Decreased range of motion, Difficulty walking, Pain, Impaired flexibility, Postural dysfunction, Improper body mechanics, Decreased mobility, Decreased strength  Visit Diagnosis: Unsteadiness on feet  Difficulty in walking, not elsewhere classified  Muscle weakness (generalized)  Pain in right hip     Problem List Patient Active Problem List   Diagnosis Date Noted   Hip dislocation, right, initial encounter ([place]) 07/04/2021   Closed fracture dislocation of right hip joint ([place]) 07/04/2021   Closed fracture of posterior wall of right acetabulum ([place]) 07/04/2021   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 [place], [place], [address] Phone: [phone]   Fax:  [phone]  Name: Halei Hanover MRN: [id number] Date of Birth: [birth year]

## 2021-09-05 NOTE — Patient Instructions (Signed)
Access Code: [email or web address] URL: [email or web address] Date: 09/05/2021 Prepared by: Ethel Rana  Exercises Supine Bridge with Resistance Band - 1 x daily - 7 x weekly - 2 sets - 10 reps Hooklying Clamshell with Resistance - 1 x daily - 7 x weekly - 2 sets - 10 reps Supine Straight Leg Raises - 1 x daily - 7 x weekly - 2 sets - 10 reps Sidelying Hip Abduction - 1 x daily - 7 x weekly - 5 sets - 10 reps Prone Hip Extension with Bent Knee - 1 x daily - 7 x weekly - 2 sets - 10 reps Prone Hip Extension - 1 x daily - 7 x weekly - 2 sets - 10 reps Standing Heel Raise - 1 x daily - 7 x weekly - 3 sets - 10 reps Standing 3-Way Leg Reach with Resistance at Ankles and Counter Support - 1 x daily - 7 x weekly - 2 sets - 10 reps Supine Hamstring Stretch with Strap - 1 x daily - 7 x weekly - 3 sets - 20 hold Supine ITB Stretch with Strap - 1 x daily - 7 x weekly - 3 sets - 20 hold Hip Adductors and Hamstring Stretch with Strap - 1 x daily - 7 x weekly - 3 sets - 20 hold Supine Piriformis Stretch with Leg Straight - 1 x daily - 7 x weekly - 3 sets - 20 hold Supine ITB Stretch - 1 x daily - 7 x weekly - 3 sets - 20 hold Hip Hiking on Step - 1 x daily - 7 x weekly - 2 sets - 10 reps

## 2021-09-12 ENCOUNTER — Ambulatory Visit: Payer: Self-pay | Admitting: Physical Therapy

## 2021-09-12 ENCOUNTER — Other Ambulatory Visit: Payer: Self-pay

## 2021-09-12 ENCOUNTER — Encounter: Payer: Self-pay | Admitting: Physical Therapy

## 2021-09-12 DIAGNOSIS — M6281 Muscle weakness (generalized): Secondary | ICD-10-CM

## 2021-09-12 DIAGNOSIS — R262 Difficulty in walking, not elsewhere classified: Secondary | ICD-10-CM

## 2021-09-12 DIAGNOSIS — R2681 Unsteadiness on feet: Secondary | ICD-10-CM

## 2021-09-12 DIAGNOSIS — M25551 Pain in right hip: Secondary | ICD-10-CM

## 2021-09-12 NOTE — Therapy (Signed)
[place] [place], [place], [address] Phone: [phone]   Fax:  [phone]  Physical Therapy Treatment  Patient Details  Name: Tina Abbott MRN: [id number] Date of Birth: [birth year] Referring Provider (PT): Mcclung   Encounter Date: 09/12/2021   PT End of Session - 09/12/21 1059     Visit Number 5    Date for PT Re-Evaluation 10/23/21    PT Start Time 7564    PT Stop Time 1059    PT Time Calculation (min) 44 min    Activity Tolerance Patient tolerated treatment well    Behavior During Therapy [facility] for tasks assessed/performed             Past Medical History:  Diagnosis Date   Broken arm 07/22/2004   left forearm    Past Surgical History:  Procedure Laterality Date   ORIF ACETABULAR FRACTURE Right 07/04/2021   Procedure: STRESS EXAM OF HIP;  Surgeon: Shona Needles, MD;  Location: [facility];  Service: Orthopedics;  Laterality: Right;    There were no vitals filed for this visit.   Subjective Assessment - 09/12/21 1018     Subjective Doing good, no new pain. IT band pain comes and goes.    Currently in Pain? No/denies                               [facility] Adult PT Treatment/Exercise - 09/12/21 0001       Ambulation/Gait   Ambulation/Gait Yes    Gait Pattern Step-through pattern    Ambulation Surface Level;Unlevel;Indoor;Outdoor;Paved    Stairs Yes    Stairs Assistance 6: Modified independent (Device/Increase time)    Stair Management Technique No rails;One rail Right;Alternating pattern    Gait Comments Gait to back building, three flights of staires, back down outsideup slope      Knee/Hip Exercises: Aerobic   Elliptical L1.6 x 3 min    Recumbent Bike L 1.9 x 4 min      Knee/Hip Exercises: Machines for Strengthening   Cybex Leg Press 40lb 2x15      Knee/Hip Exercises: Standing   Walking with Sports Cord 40lb side steps, forward with RLE 6inch step up       Knee/Hip Exercises: Supine   Bridges Both;1 set;15 reps    Other Supine Knee/Hip Exercises SL bridge x10 each                       PT Short Term Goals - 09/12/21 1100       PT SHORT TERM GOAL #1   Title I with initial HEP    Status Achieved               PT Long Term Goals - 09/12/21 1102       PT LONG TERM GOAL #1   Title I with final HEP    Status On-going      PT LONG TERM GOAL #2   Title 5x STS test in < 10 seconds without pain.    Status On-going      PT LONG TERM GOAL #3   Title Patient will ambulate at least 1000' on level and unlevel surfaces without AD, I, no unsteadiness or pain.    Status Partially Met      PT LONG TERM GOAL #4   Title R Hip strength to increase to at least 4+/5 in  all planes, without pain.    Status On-going                   Plan - 09/12/21 1102     Clinical Impression Statement Pt enters clinic without SPC. She report snot using it much. She did well with a progression to more functional interventions. Increase fatigue with stair negotiation and up hill ambulation but all completed without reports of increase pain. added leg press and resisted step ups without issue. Some LE burning with supine bridge activities.    Personal Factors and Comorbidities Profession    Examination-Activity Limitations Locomotion Level;Stairs;Squat;Stand;Lift    Examination-Participation Restrictions Occupation;Cleaning;Yard Work    [employer] Evolving/Moderate complexity    Rehab Potential Good    PT Frequency 1x / week    PT Next Visit Plan Progress activities as tolerated, increasing WB activities as her pain in WB diminishes.             Patient will benefit from skilled therapeutic intervention in order to improve the following deficits and impairments:  Abnormal gait, Decreased range of motion, Difficulty walking, Pain, Impaired flexibility, Postural dysfunction, Improper body mechanics,  Decreased mobility, Decreased strength  Visit Diagnosis: Unsteadiness on feet  Difficulty in walking, not elsewhere classified  Muscle weakness (generalized)  Pain in right hip     Problem List Patient Active Problem List   Diagnosis Date Noted   Hip dislocation, right, initial encounter ([place]) 07/04/2021   Closed fracture dislocation of right hip joint ([place]) 07/04/2021   Closed fracture of posterior wall of right acetabulum ([place]) 07/04/2021   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 [place], [place], [address] Phone: [phone]   Fax:  [phone]  Name: Tina Abbott MRN: [id number] Date of Birth: June 08, 1996

## 2021-09-19 ENCOUNTER — Ambulatory Visit: Payer: Self-pay | Attending: Student | Admitting: Physical Therapy

## 2021-09-19 ENCOUNTER — Other Ambulatory Visit: Payer: Self-pay

## 2021-09-19 DIAGNOSIS — R262 Difficulty in walking, not elsewhere classified: Secondary | ICD-10-CM | POA: Insufficient documentation

## 2021-09-19 DIAGNOSIS — M25551 Pain in right hip: Secondary | ICD-10-CM | POA: Insufficient documentation

## 2021-09-19 DIAGNOSIS — M6281 Muscle weakness (generalized): Secondary | ICD-10-CM | POA: Insufficient documentation

## 2021-09-19 DIAGNOSIS — R2681 Unsteadiness on feet: Secondary | ICD-10-CM | POA: Insufficient documentation

## 2021-09-19 NOTE — Therapy (Signed)
?[facility] [address] ?[place], [place], [address] ?Phone: [phone]   Fax:  [phone] ? ?Physical Therapy Treatment ?PHYSICAL THERAPY DISCHARGE SUMMARY ? ?Visits from Start of Care: 6 ? ?Current functional level related to goals / functional outcomes: ?All goals met ?  ?Remaining deficits: ?See functional assessment. ?  ?Education / Equipment: ?HEP  ? ?Patient agrees to discharge. Patient goals were met. Patient is being discharged due to meeting the stated rehab goals.  ?Patient Details  ?Name: Tina Abbott ?MRN: [id number] ?Date of Birth: 23-Mar-1996 ?Referring Provider (PT): Mcclung ? ? ?Encounter Date: 09/19/2021 ? ? PT End of Session - 09/19/21 1047   ? ? Visit Number 6   ? Date for PT Re-Evaluation 10/23/21   ? PT Start Time 1015   ? PT Stop Time 1048   ? PT Time Calculation (min) 33 min   ? Activity Tolerance Patient tolerated treatment well   ? Behavior During Therapy [facility] for tasks assessed/performed   ? ?  ?  ? ?  ? ? ?Past Medical History:  ?Diagnosis Date  ? Broken arm 07/22/2004  ? left forearm  ? ? ?Past Surgical History:  ?Procedure Laterality Date  ? ORIF ACETABULAR FRACTURE Right 07/04/2021  ? Procedure: STRESS EXAM OF HIP;  Surgeon: Shona Needles, MD;  Location: [facility];  Service: Orthopedics;  Laterality: Right;  ? ? ?There were no vitals filed for this visit. ? ? Subjective Assessment - 09/19/21 1043   ? ? Subjective No problems. The stretches really helped. She has started Pilates.   ? Patient is accompained by: Family member   ? How long can you sit comfortably? N/A   ? How long can you stand comfortably? Can't place all of her weight on her R LE.   ? How long can you walk comfortably? Uses RW, but not too limited.   ? Diagnostic tests X rays, CT with femoral head fracture, acetabular fractur, on R, coccyx fracture,   ? Patient Stated Goals Walk without a limp, recover full function, strength and flexibility.   ? Currently in  Pain? No/denies   ? Pain Onset More than a month ago   ? ?  ?  ? ?  ? ? ? ? ? [facility] PT Assessment - 09/19/21 0001   ? ?  ? AROM  ? Overall AROM Comments Patient's R hip ROM WNL, B hip flexors mildly tight.   ?  ? Strength  ? Right Hip Flexion 4+/5   ? Right Hip Extension 4+/5   ? Right Hip ABduction 4+/5   ? ?  ?  ? ?  ? ? ? ? ? ? ? ? ? ? ? ? ? ? ? ? [facility] Adult PT Treatment/Exercise - 09/19/21 0001   ? ?  ? Ambulation/Gait  ? Ambulation/Gait Yes   ? Gait Pattern Step-through pattern   ? Ambulation Surface Level;Unlevel;Indoor;Outdoor;Paved;Grass   ? Stairs Yes   ? Stairs Assistance 6: Modified independent (Device/Increase time)   ? Stair Management Technique No rails;One rail Right;Alternating pattern   ? Gait Comments >1000'   ? ?  ?  ? ?  ? ? ? ? ? ? ? ? ? ? PT Education - 09/19/21 1047   ? ? Education Details Educated to how to perfomr hip flexor stretch, encouraged to continue HEp as she transitions to Pilates.   ? Person(s) Educated Patient   ? Methods Explanation   ? Comprehension Verbalized understanding   ? ?  ?  ? ?  ? ? ?  PT Short Term Goals - 09/12/21 1100   ? ?  ? PT SHORT TERM GOAL #1  ? Title I with initial HEP   ? Status Achieved   ? ?  ?  ? ?  ? ? ? ? PT Long Term Goals - 09/19/21 1023   ? ?  ? PT LONG TERM GOAL #1  ? Title I with final HEP   ? Status Achieved   ?  ? PT LONG TERM GOAL #2  ? Title 5x STS test in < 10 seconds without pain.   ? Baseline 7.58-no pain   ? Status Achieved   ?  ? PT LONG TERM GOAL #3  ? Title Patient will ambulate at least 1000' on level and unlevel surfaces without AD, I, no unsteadiness or pain.   ? Baseline >1000', including up and down 20 steps x 3, no c/O pain, no unsteadiness.   ? Status Achieved   ?  ? PT LONG TERM GOAL #4  ? Title R Hip strength to increase to at least 4+/5 in all planes, without pain.   ? Baseline 4+/5   ? Status Achieved   ?  ? PT LONG TERM GOAL #5  ? Title Patient will perform SLS on RLE x at least 30 seconds on non-compliant surface, with L hip  movement in flex/ext and side to side shifts to demonstrate functional strength in R hip.   ? Status Achieved   ? ?  ?  ? ?  ? ? ? ? ? ? ? ? Plan - 09/19/21 1048   ? ? Clinical Impression Statement Patient reports no issues, has begun Pilates. Functional status re-assessed, with patient meeting all LTG. She verbalized that she feels ready for D/C. Encouraged to continue HEP as she transitions to Pilates.   ? Personal Factors and Comorbidities Profession   ? Examination-Activity Limitations Locomotion Level;Stairs;Squat;Stand;Lift   ? Examination-Participation Restrictions Occupation;Cleaning;[employer] Work   ? Stability/Clinical Decision Making Evolving/Moderate complexity   ? Clinical Decision Making Low   ? Rehab Potential Good   ? PT Frequency 1x / week   ? PT Treatment/Interventions ADLs/Self Care Home Management;Iontophoresis 4mg /ml Dexamethasone;Gait training;Stair training;Balance training;Therapeutic exercise;Therapeutic activities;Functional mobility training;Patient/family education;Passive range of motion   ? PT Next Visit Plan Progress activities as tolerated, increasing WB activities as her pain in WB diminishes.   ? PT Home Exercise Plan [phone]   ? Consulted and Agree with Plan of Care Patient   ? ?  ?  ? ?  ? ? ?Patient will benefit from skilled therapeutic intervention in order to improve the following deficits and impairments:  Abnormal gait, Decreased range of motion, Difficulty walking, Pain, Impaired flexibility, Postural dysfunction, Improper body mechanics, Decreased mobility, Decreased strength ? ?Visit Diagnosis: ?Unsteadiness on feet ? ?Difficulty in walking, not elsewhere classified ? ?Muscle weakness (generalized) ? ?Pain in right hip ? ? ? ? ?Problem List ?Patient Active Problem List  ? Diagnosis Date Noted  ? Hip dislocation, right, initial encounter ([place]) 07/04/2021  ? Closed fracture dislocation of right hip joint ([place]) 07/04/2021  ? Closed fracture of posterior wall of right acetabulum  ([place]) 07/04/2021  ? 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 NOSEBLEED 10/16/2010  ? TROCHANTERIC BURSITIS, RIGHT 06/21/2009  ? FOOT PAIN, BILATERAL 06/21/2009  ? ? ?Marcelina Morel, DPT ?09/19/2021, 10:50 AM ? ?[facility] ?[address] ?[address] ?[place], [place], [address] ?Phone: [phone]   Fax:  [phone] ? ?  Name: Mckell Riecke ?MRN: [id number] ?Date of Birth: [birth year] ? ? ? ?

## 2021-09-26 ENCOUNTER — Ambulatory Visit: Payer: Self-pay | Admitting: Physical Therapy

## 2021-10-03 ENCOUNTER — Ambulatory Visit: Payer: Self-pay | Admitting: Physical Therapy

## 2021-11-02 ENCOUNTER — Other Ambulatory Visit (HOSPITAL_COMMUNITY): Payer: Self-pay

## 2023-03-08 IMAGING — CT CT PELVIS W/O CM
2 of 3 series · 15 of 46 positions shown, 17 images · non-contrast
Comparison: Hip series 07/03/21, 07/04/21

CLINICAL DATA: Pelvic fracture.

EXAM:
CT PELVIS WITHOUT CONTRAST
TECHNIQUE: Multidetector CT imaging of the pelvis was performed following the
standard protocol without intravenous contrast.

[Series 3: axial st · axial · 0.72mm/px · z∈[-279,-81]mm · 12 of 115 slices shown, 14 images]
[im 8/115  soft-tissue]
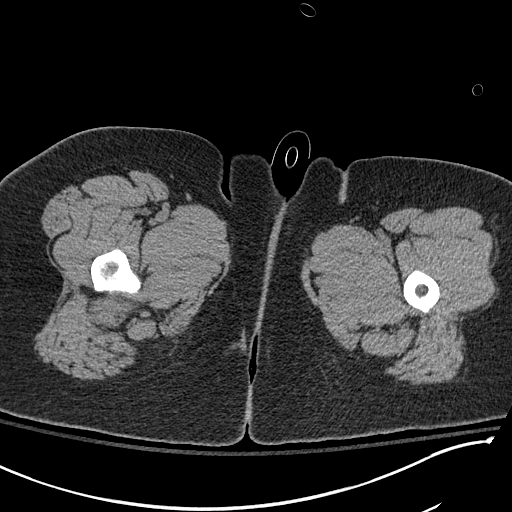
[im 8/115  bone]
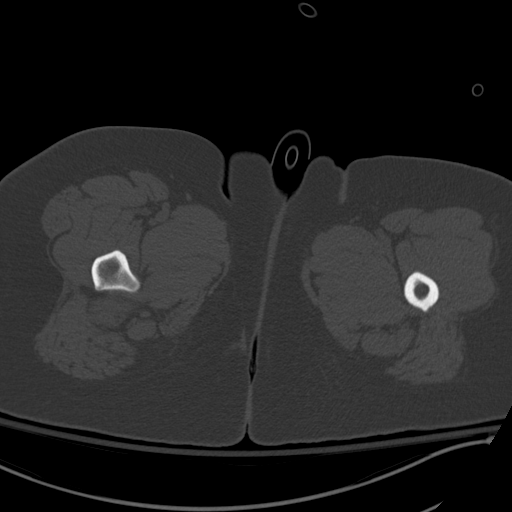
[im 15/115  soft-tissue]
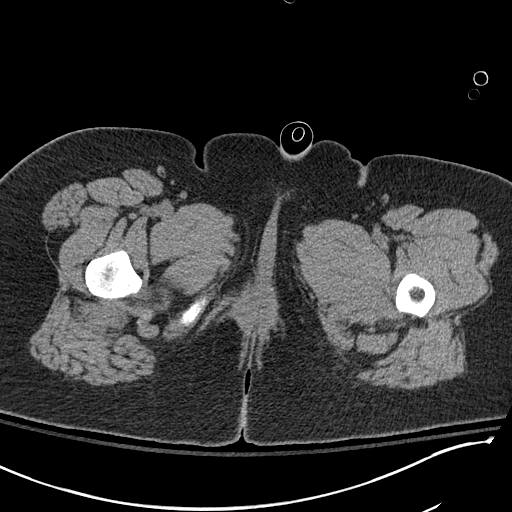
[im 26/115  soft-tissue]
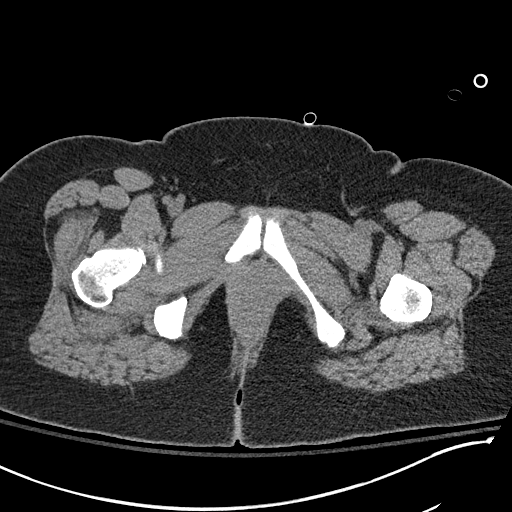
[im 34/115  soft-tissue]
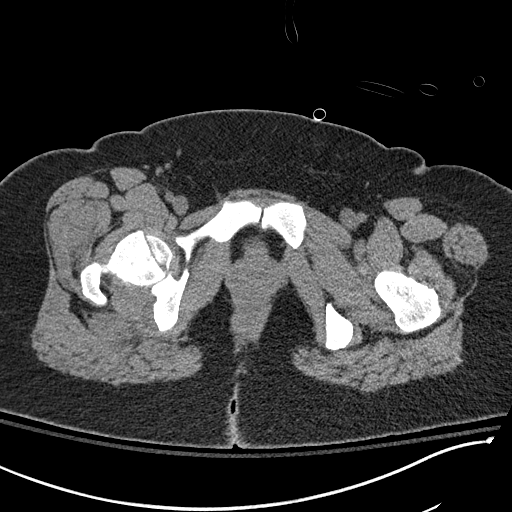
[im 45/115  soft-tissue]
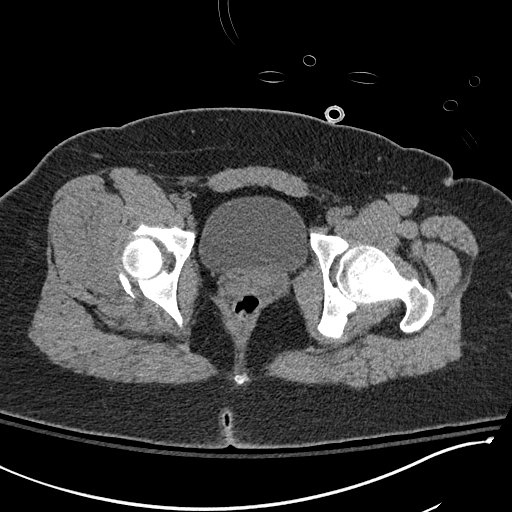
[im 52/115  soft-tissue]
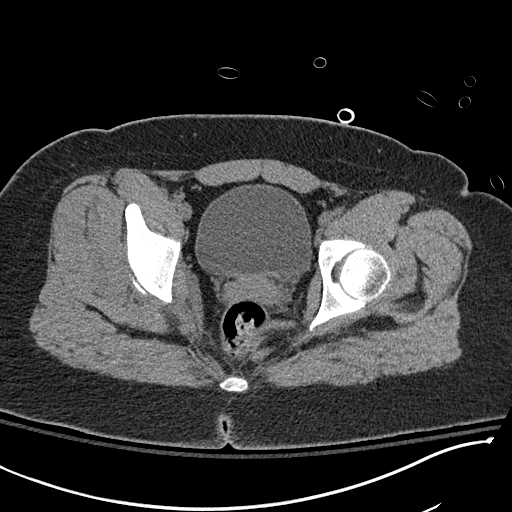
[im 63/115  soft-tissue]
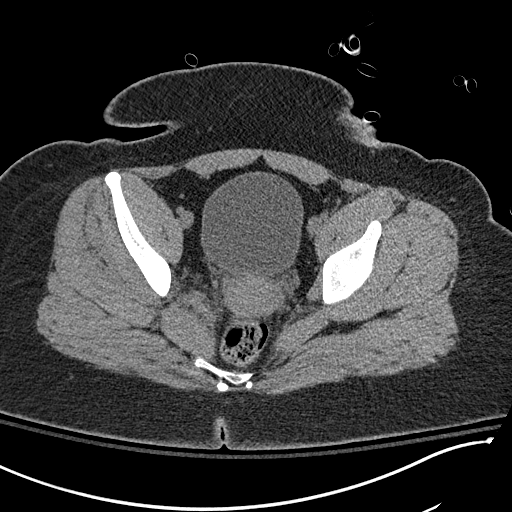
[im 70/115  soft-tissue]
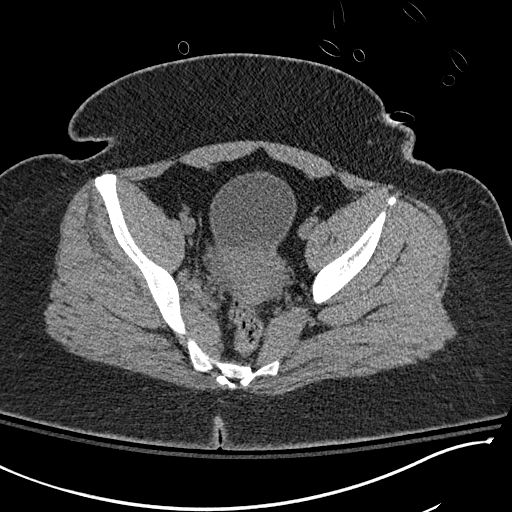
[im 81/115  soft-tissue]
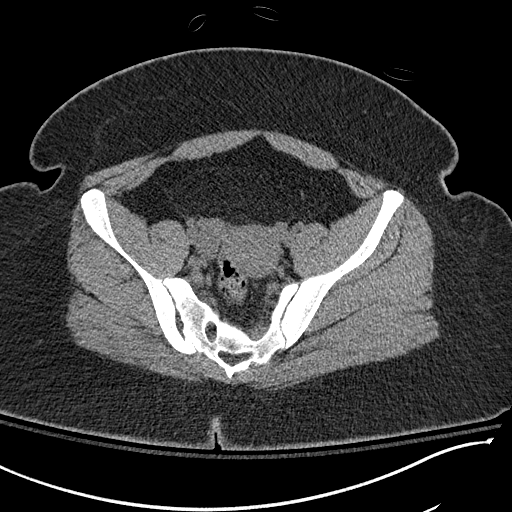
[im 81/115  bone]
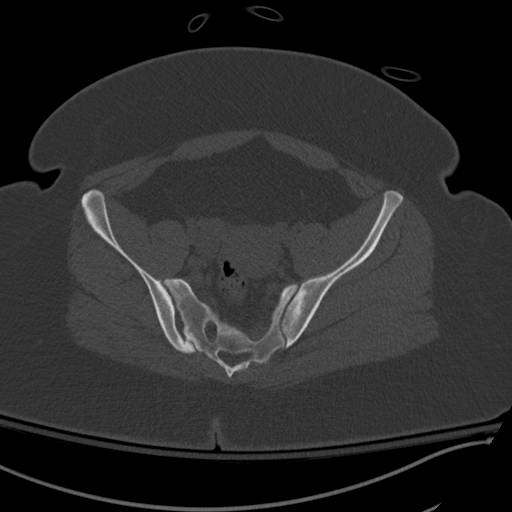
[im 89/115  soft-tissue]
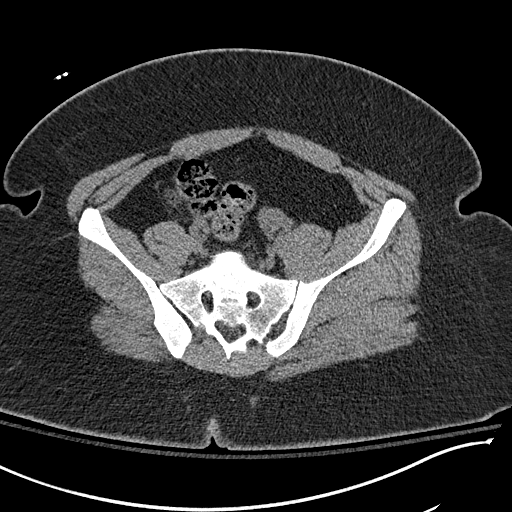
[im 100/115  soft-tissue]
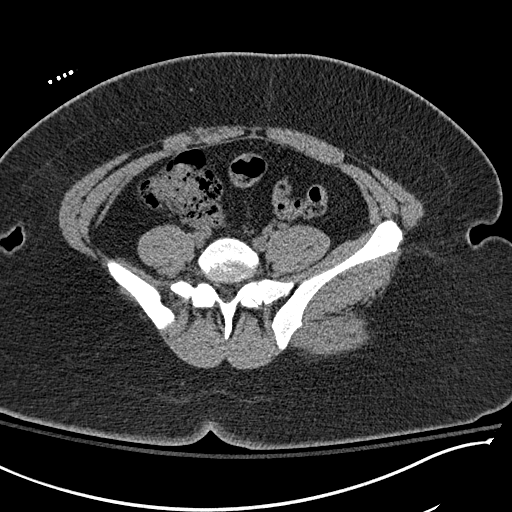
[im 107/115  soft-tissue]
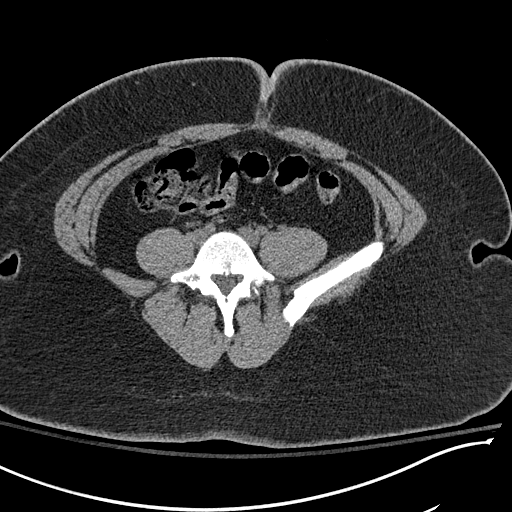

[Series 6: coronal st · coronal · 0.50mm/px · 3 of 118 slices shown]
[im 40/118  soft-tissue]
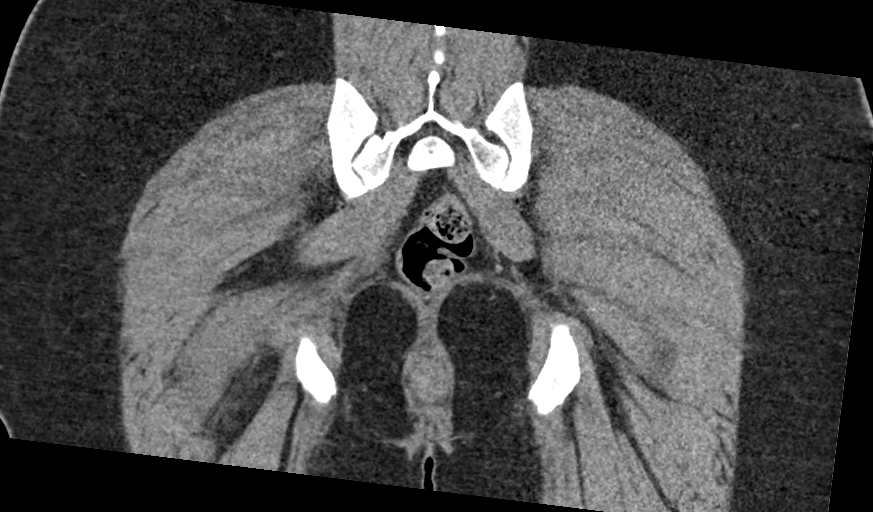
[im 53/118  soft-tissue]
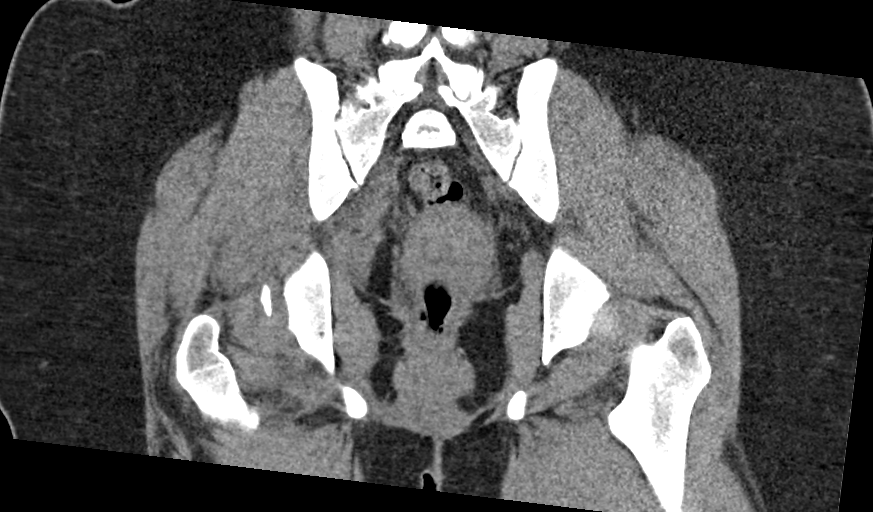
[im 66/118  soft-tissue]
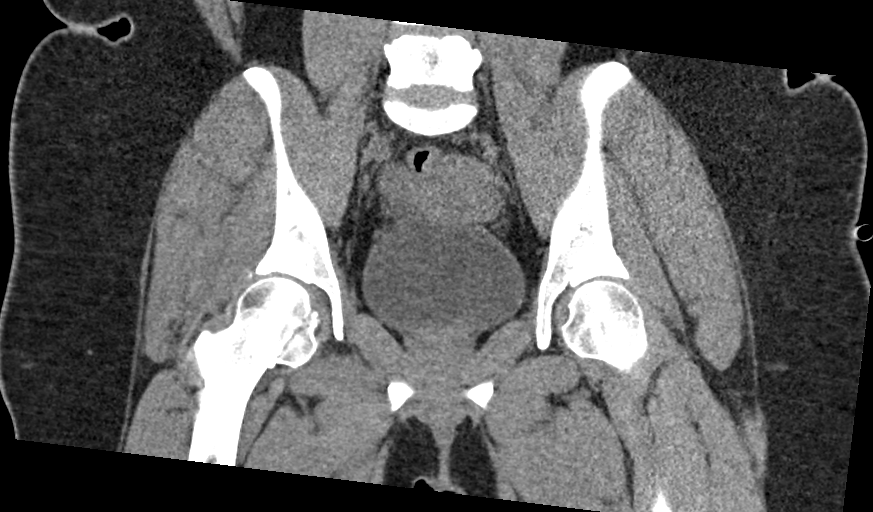

[15 of 46 positions shown; findings below may reference images not displayed]

FINDINGS: Urinary Tract:  No abnormality visualized.

Bowel:  Unremarkable visualized pelvic bowel loops.

Vascular/Lymphatic: No pathologically enlarged lymph nodes. No
significant vascular abnormality seen without contrast.

Reproductive:  No mass or other significant abnormality

Other: There is no free air. There is a small retroperitoneal
hemorrhage collecting along the right posterior pelvic wall. Small
umbilical fat hernia.

Musculoskeletal: There is comminuted fracture of the upper lateral
acetabular wall, with multiple mildly distracted linear fragments
beginning just posterior to the mid coronal plane. No other fracture
is seen in the pelvis. The sacrum is intact. There is an oblique
nondisplaced fracture of the third coccygeal segment.

There is an intra-articular oblique fracture of the lower medial
aspect of the right femoral head extending to the junction with the
femoral neck with the fracture fragment translated inferiorly up to
1 cm and a few tiny comminution fragments between fracture margins.

There is associated right hip hemarthrosis but no regional
intramuscular hematoma. The proximal left femur is intact.

Incidentally noted are mild features of chronic symmetric
sacroiliitis.
IMPRESSION: 1. Intra-articular fracture of the inferomedial right humeral head
extending to the junction with the femoral neck, with up to 1 cm
inferior translation of the fracture fragment and tiny comminution
fragments.
2. Comminuted fracture of the upper lateral acetabular wall
beginning just posterior to the mid coronal plane, with mildly
distracted fragments.
3. Nondisplaced oblique fracture of the third coccygeal segment.
4. Right hip hemarthrosis.
5. Small retroperitoneal hemorrhage along the right posterior pelvic
wall.
6. Mild features of chronic symmetric sacroiliitis.
